# Patient Record
Sex: Female | Born: 1935 | Race: White | Hispanic: No | Marital: Single | Smoking: Never smoker
Health system: Southern US, Community
[De-identification: ages and names within clinical notes are randomized; demographics above are authoritative.]

## PROBLEM LIST (undated history)

## (undated) DIAGNOSIS — Z66 Do not resuscitate: Secondary | ICD-10-CM

## (undated) DIAGNOSIS — G43909 Migraine, unspecified, not intractable, without status migrainosus: Secondary | ICD-10-CM

## (undated) DIAGNOSIS — E538 Deficiency of other specified B group vitamins: Secondary | ICD-10-CM

## (undated) DIAGNOSIS — R413 Other amnesia: Secondary | ICD-10-CM

## (undated) DIAGNOSIS — E079 Disorder of thyroid, unspecified: Secondary | ICD-10-CM

## (undated) DIAGNOSIS — M810 Age-related osteoporosis without current pathological fracture: Secondary | ICD-10-CM

## (undated) DIAGNOSIS — C719 Malignant neoplasm of brain, unspecified: Secondary | ICD-10-CM

## (undated) DIAGNOSIS — I10 Essential (primary) hypertension: Secondary | ICD-10-CM

## (undated) HISTORY — PX: BRAIN SURGERY: SHX531

## (undated) HISTORY — PX: APPENDECTOMY: SHX54

---

## 2011-09-17 ENCOUNTER — Ambulatory Visit: Payer: Self-pay | Admitting: Physical Therapy

## 2012-04-12 ENCOUNTER — Encounter (HOSPITAL_COMMUNITY): Payer: Self-pay

## 2012-04-12 ENCOUNTER — Emergency Department (HOSPITAL_COMMUNITY): Payer: Medicare Other

## 2012-04-12 ENCOUNTER — Emergency Department (HOSPITAL_COMMUNITY)
Admission: EM | Admit: 2012-04-12 | Discharge: 2012-04-13 | Disposition: A | Discharge status: Skilled Nursing Facility | Payer: Medicare Other | Attending: Emergency Medicine | Admitting: Emergency Medicine

## 2012-04-12 DIAGNOSIS — S99921A Unspecified injury of right foot, initial encounter: Secondary | ICD-10-CM

## 2012-04-12 DIAGNOSIS — M79609 Pain in unspecified limb: Secondary | ICD-10-CM | POA: Insufficient documentation

## 2012-04-12 DIAGNOSIS — M810 Age-related osteoporosis without current pathological fracture: Secondary | ICD-10-CM | POA: Insufficient documentation

## 2012-04-12 DIAGNOSIS — R1013 Epigastric pain: Secondary | ICD-10-CM

## 2012-04-12 DIAGNOSIS — Y93E2 Activity, laundry: Secondary | ICD-10-CM | POA: Insufficient documentation

## 2012-04-12 DIAGNOSIS — S8990XA Unspecified injury of unspecified lower leg, initial encounter: Secondary | ICD-10-CM | POA: Insufficient documentation

## 2012-04-12 DIAGNOSIS — Z66 Do not resuscitate: Secondary | ICD-10-CM | POA: Insufficient documentation

## 2012-04-12 DIAGNOSIS — R296 Repeated falls: Secondary | ICD-10-CM | POA: Insufficient documentation

## 2012-04-12 DIAGNOSIS — I10 Essential (primary) hypertension: Secondary | ICD-10-CM | POA: Insufficient documentation

## 2012-04-12 HISTORY — DX: Age-related osteoporosis without current pathological fracture: M81.0

## 2012-04-12 HISTORY — DX: Disorder of thyroid, unspecified: E07.9

## 2012-04-12 HISTORY — DX: Migraine, unspecified, not intractable, without status migrainosus: G43.909

## 2012-04-12 HISTORY — DX: Malignant neoplasm of brain, unspecified: C71.9

## 2012-04-12 HISTORY — DX: Essential (primary) hypertension: I10

## 2012-04-12 HISTORY — DX: Other amnesia: R41.3

## 2012-04-12 HISTORY — DX: Do not resuscitate: Z66

## 2012-04-12 HISTORY — DX: Deficiency of other specified B group vitamins: E53.8

## 2012-04-12 LAB — BASIC METABOLIC PANEL
CO2: 26 mEq/L (ref 19–32)
Calcium: 9.9 mg/dL (ref 8.4–10.5)
Glucose, Bld: 89 mg/dL (ref 70–99)
Sodium: 141 mEq/L (ref 135–145)

## 2012-04-12 LAB — CBC
HCT: 37.1 % (ref 36.0–46.0)
Hemoglobin: 12.4 g/dL (ref 12.0–15.0)
MCH: 30 pg (ref 26.0–34.0)
RBC: 4.13 MIL/uL (ref 3.87–5.11)

## 2012-04-12 MED ORDER — ALUM & MAG HYDROXIDE-SIMETH 200-200-20 MG/5ML PO SUSP
15.0000 mL | Freq: Once | ORAL | Status: AC
  Administered 2012-04-12: 15 mL via ORAL
  Filled 2012-04-12: qty 30

## 2012-04-12 MED ORDER — ASPIRIN 81 MG PO CHEW
324.0000 mg | CHEWABLE_TABLET | Freq: Once | ORAL | Status: AC
  Administered 2012-04-12: 324 mg via ORAL
  Filled 2012-04-12: qty 4

## 2012-04-12 NOTE — ED Notes (Signed)
Per [facility]- Pt presents in no acute distress- Pt resides at [facility] DNR yellow cop present-  Pt informed EMS- Pt was hanging clothes in closet and tripped and fell. Only complaint rt foot pain- no deformity present.  Pt found by EMS- sitting in rec room sitting with family- alert and active with care.  Only pain is with movement to rt foot

## 2012-04-13 LAB — POCT I-STAT TROPONIN I: Troponin i, poc: 0 ng/mL (ref 0.00–0.08)

## 2012-04-13 MED ORDER — ACETAMINOPHEN 500 MG PO TABS: 500.0000 mg | ORAL_TABLET | Freq: Four times a day (QID) | ORAL | Status: AC | PRN

## 2012-04-13 MED ORDER — ALUM & MAG HYDROXIDE-SIMETH 200-200-20 MG/5ML PO SUSP: 15.0000 mL | ORAL | Status: AC | PRN

## 2012-04-13 NOTE — ED Provider Notes (Signed)
History     CSN: [id number]  Arrival date & time 04/12/12  1825   First MD Initiated Contact with Patient 04/12/12 1945      Chief Complaint  Patient presents with  . Fall  . Foot Pain    (Consider location/radiation/quality/duration/timing/severity/associated sxs/prior treatment) The history is provided by the patient and a relative.   patient is an 78 year old female who presents the emergency department with a chief complaint of a fall that occurred around lunchtime today. She was pain closed in the closet in the door began to close, making her lose her balance and fall to ground. Although she did not initially have any pain, she began to develop pain to the dorsum of the right forefoot and midfoot. She denies any wounds or skin color changes. Denies any new numbness or weakness. Denies any increase in swelling from her baseline; she has chronic lymphedema to bilateral lower extremities. Her pain is worse with attempted ambulation and movement of the foot. No alleviating factors. No prior treatment attempted. In addition, the patient and her daughter note that for the last 2 weeks she has had epigastric  Burning with radiation to the substernal region that occurs after every meal. There is no associated shortness of breath, nausea, diaphoresis. She has had no fever or upper respiratory illness recently. She has attempted no prior treatment. She has no known history of coronary artery disease or hyperlipidemia; does note a history of hypertension. Has had negative cardiac testing in the past though it has been several years. Recently moved to the area.  Past Medical History  Diagnosis Date  . Hypertension   . Migraine, unspecified, without mention of intractable migraine without mention of status migrainosus   . Memory loss   . Osteoporosis   . Unspecified disorder of thyroid   . Malignant neoplasm of brain, unspecified site   . Other B-complex deficiencies   . DNR (do not resuscitate)      No past surgical history on file.  No family history on file.  History  Substance Use Topics  . Smoking status: No  . Smokeless tobacco: Not on file  . Alcohol Use: No     Review of Systems 10 systems reviewed and are negative for acute change except as noted in the HPI.  Allergies  Banana; Demerol; and Penicillins  Home Medications   Current Outpatient Rx  Name Route Sig Dispense Refill  . BISOPROLOL FUMARATE 10 MG PO TABS Oral Take 10 mg by mouth daily.    Marland Kitchen VITAMIN B-12 100 MCG PO TABS Oral Take 50 mcg by mouth daily.      BP 163/87  Pulse 63  Temp 97.8 F (36.6 C) (Oral)  Resp 18  Wt 135 lb (61.236 kg)  SpO2 100%  Physical Exam  Nursing note and vitals reviewed. Constitutional: She is oriented to person, place, and time. She appears well-developed and well-nourished. No distress.  HENT:  Head: Normocephalic and atraumatic.  Right Ear: External ear normal.  Left Ear: External ear normal.  Mouth/Throat: Oropharynx is clear and moist.  Eyes: Conjunctivae are normal.  Neck: Neck supple.  Cardiovascular: Normal rate, regular rhythm and normal heart sounds.   No murmur heard.      Bilateral radial and DP pulses are 2+  Pulmonary/Chest: Effort normal and breath sounds normal. No respiratory distress. She has no wheezes. She has no rales. She exhibits no tenderness.  Abdominal: Soft. Bowel sounds are normal. She exhibits no distension. There is no  tenderness. There is no guarding.  Musculoskeletal:       Right foot: She exhibits tenderness. She exhibits normal range of motion, no bony tenderness and normal capillary refill.       2+ edema noted to bilateral lower legs and feet.  Neurological: She is alert and oriented to person, place, and time.       Strength to bilateral plantar and dorsi flexion is 4/5 and symmetric. Sensation is intact to light touch in bilateral lower extremities.  Skin: Skin is warm and dry. No rash noted. No erythema.    ED Course    Procedures (including critical care time)  Labs Reviewed  BASIC METABOLIC PANEL - Abnormal; Notable for the following:    GFR calc non Af Amer 85 (*)     All other components within normal limits  CBC  TROPONIN I  POCT I-STAT TROPONIN I  LAB REPORT - SCANNED   Dg Chest 2 View  04/12/2012  *RADIOLOGY REPORT*  Clinical Data: Epigastric pain  CHEST - 2 VIEW  Comparison: None.  Findings: Moderate cardiomegaly.  Clear lungs.  Osteopenia. Wedging of mid level thoracic vertebral bodies has a chronic appearance.  No definite acute fracture.  No pneumothorax.  No pleural effusion.  IMPRESSION: Cardiomegaly without edema.  Original Report Authenticated By: Donavan Burnet, M.D.   Dg Foot Complete Right  04/12/2012  *RADIOLOGY REPORT*  Clinical Data: Pain.  Fall.  RIGHT FOOT COMPLETE - 3+ VIEW  Comparison: None.  Findings: Osteopenia.  Mild hallux valgus.  No acute fracture and no dislocation.  Marked soft tissue swelling over the dorsum of the forefoot.  Minimal spurring at the posterior calcaneus.  Mild degenerative change in the midfoot.  IMPRESSION: No acute bony pathology.  Soft tissue swelling over the forefoot. Degenerative changes.  Original Report Authenticated By: Donavan Burnet, M.D.     No diagnosis found.    MDM  Patient is a DO NOT RESUSCITATE. Multiple complaints in elderly female. Regarding foot pain after fall, there is no deformity on exam and x-rays significant only for soft tissue swelling that may be chronic. No neurologic or motor deficit. Patient was able to ambulate in the emergency department with assistance of a walker, as per her norm. Regarding epigastric pain, suspect related to GI etiology. However, given her age, and EKG, chest x-ray, and cardiac labs are ordered for further evaluation. Maalox was given in emergency department with complete symptom relief. Troponin is negative x2. EKG with no acute findings, sinus with T wave inversion in aVR, leads V1 and V2. No priors  available for comparison. Doubt ACS. Patient is discussed with attending physician and it is felt that admission to the hospital for further evaluation is not warranted given her history, PE, and ED findings. Strict return precautions were discussed. She will follow-up with her primary doctor as soon as possible for recheck.         Shaaron Adler, [place] 04/13/12 2235

## 2012-04-14 ENCOUNTER — Other Ambulatory Visit: Payer: Self-pay | Admitting: Geriatric Medicine

## 2012-04-14 DIAGNOSIS — Z1231 Encounter for screening mammogram for malignant neoplasm of breast: Secondary | ICD-10-CM

## 2012-04-14 NOTE — ED Provider Notes (Signed)
Medical screening examination/treatment/procedure(s) were conducted as a shared visit with non-physician practitioner(s) and myself.  I personally evaluated the patient during the encounter  Allison Kastens, MD 04/14/12 0212 

## 2012-04-25 ENCOUNTER — Ambulatory Visit: Payer: Medicare Other

## 2012-07-01 ENCOUNTER — Emergency Department (HOSPITAL_COMMUNITY): Payer: Medicare Other

## 2012-07-01 ENCOUNTER — Encounter (HOSPITAL_COMMUNITY): Payer: Self-pay | Admitting: *Deleted

## 2012-07-01 ENCOUNTER — Emergency Department (HOSPITAL_COMMUNITY)
Admission: EM | Admit: 2012-07-01 | Discharge: 2012-07-01 | Disposition: A | Discharge status: Home / Self Care | Payer: Medicare Other | Attending: Emergency Medicine | Admitting: Emergency Medicine

## 2012-07-01 DIAGNOSIS — Y998 Other external cause status: Secondary | ICD-10-CM | POA: Insufficient documentation

## 2012-07-01 DIAGNOSIS — Z66 Do not resuscitate: Secondary | ICD-10-CM | POA: Insufficient documentation

## 2012-07-01 DIAGNOSIS — E079 Disorder of thyroid, unspecified: Secondary | ICD-10-CM | POA: Insufficient documentation

## 2012-07-01 DIAGNOSIS — S8000XA Contusion of unspecified knee, initial encounter: Secondary | ICD-10-CM | POA: Insufficient documentation

## 2012-07-01 DIAGNOSIS — I10 Essential (primary) hypertension: Secondary | ICD-10-CM | POA: Insufficient documentation

## 2012-07-01 DIAGNOSIS — Z85841 Personal history of malignant neoplasm of brain: Secondary | ICD-10-CM | POA: Insufficient documentation

## 2012-07-01 DIAGNOSIS — Z79899 Other long term (current) drug therapy: Secondary | ICD-10-CM | POA: Insufficient documentation

## 2012-07-01 DIAGNOSIS — S42293A Other displaced fracture of upper end of unspecified humerus, initial encounter for closed fracture: Secondary | ICD-10-CM | POA: Insufficient documentation

## 2012-07-01 DIAGNOSIS — M810 Age-related osteoporosis without current pathological fracture: Secondary | ICD-10-CM | POA: Insufficient documentation

## 2012-07-01 DIAGNOSIS — S42202A Unspecified fracture of upper end of left humerus, initial encounter for closed fracture: Secondary | ICD-10-CM

## 2012-07-01 DIAGNOSIS — Y9210 Unspecified residential institution as the place of occurrence of the external cause: Secondary | ICD-10-CM | POA: Insufficient documentation

## 2012-07-01 DIAGNOSIS — W010XXA Fall on same level from slipping, tripping and stumbling without subsequent striking against object, initial encounter: Secondary | ICD-10-CM | POA: Insufficient documentation

## 2012-07-01 DIAGNOSIS — G43909 Migraine, unspecified, not intractable, without status migrainosus: Secondary | ICD-10-CM | POA: Insufficient documentation

## 2012-07-01 DIAGNOSIS — F0390 Unspecified dementia without behavioral disturbance: Secondary | ICD-10-CM | POA: Insufficient documentation

## 2012-07-01 DIAGNOSIS — R413 Other amnesia: Secondary | ICD-10-CM | POA: Insufficient documentation

## 2012-07-01 MED ORDER — ACETAMINOPHEN 325 MG PO TABS
650.0000 mg | ORAL_TABLET | Freq: Once | ORAL | Status: AC
  Administered 2012-07-01: 650 mg via ORAL

## 2012-07-01 MED ORDER — HYDROCODONE-ACETAMINOPHEN 5-325 MG PO TABS: 1.0000 | ORAL_TABLET | Freq: Four times a day (QID) | ORAL | Status: DC | PRN

## 2012-07-01 MED ORDER — ACETAMINOPHEN 325 MG PO TABS
ORAL_TABLET | ORAL | Status: AC
  Administered 2012-07-01: 650 mg via ORAL
  Filled 2012-07-01: qty 2

## 2012-07-01 NOTE — ED Notes (Signed)
[facility] arrived to pick pt up, transported with chart, DNR form and personal belongings, condition stable at time of transfer.

## 2012-07-01 NOTE — ED Notes (Signed)
Per EMS, pt from [facility] after walker slipped on her way to dining room causing her to fall and when she felt the walker slipping she reached for the rail injuring left shoulder and fell on right knee. EMS reports deformity and swelling. EMS reports inserting 20G IV in left forearm with IV Fentanyl given en route.

## 2012-07-01 NOTE — ED Notes (Signed)
RUE:[id number]<JW> Expected date:<BR> Expected time:<BR> Means of arrival:<BR> Comments:<BR> Fall-shoulder pain

## 2012-07-01 NOTE — ED Notes (Signed)
MD at bedside. 

## 2012-07-01 NOTE — ED Provider Notes (Signed)
History     CSN: [id number]  Arrival date & time 07/01/12  1401   First MD Initiated Contact with Patient 07/01/12 1458     Level V caveat history history is obtained from patient and patient's family and from records accompanying patient Chief Complaint  Patient presents with  . Fall  . Shoulder Pain    left    (Consider location/radiation/quality/duration/timing/severity/associated sxs/prior treatment) Patient is a 78 y.o. female presenting with fall and shoulder pain.  Fall  Shoulder Pain   Patient tripped and fell while walking with her walker and assisted-living facility today approximately 12 noon injuring left shoulder and right knee she denies other injury denies other complaint denies loss of consciousness denies neck pain . Treated by EMS with IV fentanyl prior to coming here pain worse with palpation or movement Past Medical History  Diagnosis Date  . Hypertension   . Migraine, unspecified, without mention of intractable migraine without mention of status migrainosus   . Memory loss   . Osteoporosis   . Unspecified disorder of thyroid   . Malignant neoplasm of brain, unspecified site   . Other B-complex deficiencies   . DNR (do not resuscitate)     History reviewed. No pertinent past surgical history.  History reviewed. No pertinent family history.  History  Substance Use Topics  . Smoking status: Never Smoker   . Smokeless tobacco: Never Used  . Alcohol Use: No    OB History    Grav Para Term Preterm Abortions TAB SAB Ect Mult Living                  Review of Systems  Unable to perform ROS: Dementia  Musculoskeletal: Positive for arthralgias.    Allergies  Banana; Demerol; and Penicillins  Home Medications   Current Outpatient Rx  Name Route Sig Dispense Refill  . ACETAMINOPHEN 500 MG PO TABS Oral Take 500 mg by mouth every 4 (four) hours as needed. pain    . BISOPROLOL FUMARATE 10 MG PO TABS Oral Take 10 mg by mouth daily.    Marland Kitchen VITAMIN  D 1000 UNITS PO TABS Oral Take 1,000 Units by mouth daily.    . DONEPEZIL HCL 10 MG PO TABS Oral Take 10 mg by mouth at bedtime.    . OMEPRAZOLE 20 MG PO CPDR Oral Take 20 mg by mouth daily.    Marland Kitchen POLYETHYLENE GLYCOL 3350 PO PACK Oral Take 17 g by mouth daily.    . SENNOSIDES 8.6 MG PO TABS Oral Take 1 tablet by mouth daily.    Marland Kitchen VITAMIN B-12 1000 MCG PO TABS Oral Take 2,000 mcg by mouth daily.      BP 173/78  Pulse 60  Temp 98.2 F (36.8 C) (Oral)  Resp 18  SpO2 97%  Physical Exam  Nursing note and vitals reviewed. Constitutional: She appears well-developed and well-nourished.  HENT:  Head: Normocephalic and atraumatic.  Eyes: Conjunctivae normal are normal. Pupils are equal, round, and reactive to light.  Neck: Neck supple. No tracheal deviation present. No thyromegaly present.  Cardiovascular: Normal rate and regular rhythm.   No murmur heard. Pulmonary/Chest: Effort normal and breath sounds normal.  Abdominal: Soft. Bowel sounds are normal. She exhibits no distension. There is no tenderness.  Musculoskeletal: Normal range of motion. She exhibits no edema and no tenderness.       Left upper extremity tender at shoulder no obvious deformity radial pulse 2+ pain with active range of motion. Right lower extremity  2 cm ecchymosis overlying anterior knee with course point tenderness no deformity. Bilateral lower extremities with 3+ edema DP pulses 2+ bilaterally. Entire spine nontender pelvis stable nontender  Neurological: She is alert. Coordination normal.       Follow simple commands moves all extremities  Skin: Skin is warm and dry. No rash noted.  Psychiatric: She has a normal mood and affect.    ED Course  Procedures (including critical care time)  Labs Reviewed - No data to display No results found. Results for orders placed during the hospital encounter of 04/12/12  CBC      Component Value Range   WBC 6.5  4.0 - 10.5 K/uL   RBC 4.13  3.87 - 5.11 MIL/uL   Hemoglobin  12.4  12.0 - 15.0 g/dL   HCT 16.1  09.6 - 04.5 %   MCV 89.8  78.0 - 100.0 fL   MCH 30.0  26.0 - 34.0 pg   MCHC 33.4  30.0 - 36.0 g/dL   RDW 40.9  81.1 - 91.4 %   Platelets 322  150 - 400 K/uL  BASIC METABOLIC PANEL      Component Value Range   Sodium 141  135 - 145 mEq/L   Potassium 3.7  3.5 - 5.1 mEq/L   Chloride 104  96 - 112 mEq/L   CO2 26  19 - 32 mEq/L   Glucose, Bld 89  70 - 99 mg/dL   BUN 21  6 - 23 mg/dL   Creatinine, Ser 7.82  0.50 - 1.10 mg/dL   Calcium 9.9  8.4 - 95.6 mg/dL   GFR calc non Af Amer 85 (*) >90 mL/min   GFR calc Af Amer >90  >90 mL/min  TROPONIN I      Component Value Range   Troponin I <0.30  <0.30 ng/mL  POCT I-STAT TROPONIN I      Component Value Range   Troponin i, poc 0.00  0.00 - 0.08 ng/mL   Comment 3            Dg Shoulder Left  07/01/2012  *RADIOLOGY REPORT*  Clinical Data: Fall.  Shoulder pain.  LEFT SHOULDER - 2+ VIEW  Comparison: None.  Findings: Comminuted fracture of the left humeral neck and trochanteric region.  The humeral head remains aligned with the glenoid.  Mildly tortuous aorta.  IMPRESSION: Comminuted fracture of the left humeral neck and trochanteric region.  The humeral head remains aligned with the glenoid.   Original Report Authenticated By: Fuller Canada, M.D.    Dg Knee Complete 4 Views Right  07/01/2012  *RADIOLOGY REPORT*  Clinical Data: Fall.  RIGHT KNEE - COMPLETE 4+ VIEW  Comparison: None.  Findings: No fracture or dislocation.  CPPD.  IMPRESSION: No fracture or dislocation.   Original Report Authenticated By: Fuller Canada, M.D.      No diagnosis found.  4:45 PM patient comfortable after treatment with Tylenol X-rays reviewed by me  MDM  Plan shoulder immobilizer Prescription Norco Followup Dr.Norris (patient and family request) Diagnosis #1 fall #2 fracture left proximal humerus #3 contusion to right knee        Doug Sou, MD 07/01/12 1658

## 2012-07-01 NOTE — ED Notes (Signed)
[facility] called and informed of pt's return, report given to [place], Charity fundraiser. [facility] also called for transport back, awaiting arrival.

## 2013-10-28 ENCOUNTER — Encounter (HOSPITAL_COMMUNITY): Payer: Self-pay | Admitting: Emergency Medicine

## 2013-10-28 ENCOUNTER — Emergency Department (HOSPITAL_COMMUNITY)
Admission: EM | Admit: 2013-10-28 | Discharge: 2013-10-28 | Disposition: A | Discharge status: Other Acute Inpatient Hospital | Payer: Medicare Other | Attending: Emergency Medicine | Admitting: Emergency Medicine

## 2013-10-28 DIAGNOSIS — Z79899 Other long term (current) drug therapy: Secondary | ICD-10-CM | POA: Insufficient documentation

## 2013-10-28 DIAGNOSIS — R112 Nausea with vomiting, unspecified: Secondary | ICD-10-CM

## 2013-10-28 DIAGNOSIS — Z880 Allergy status to penicillin: Secondary | ICD-10-CM | POA: Insufficient documentation

## 2013-10-28 DIAGNOSIS — R413 Other amnesia: Secondary | ICD-10-CM | POA: Insufficient documentation

## 2013-10-28 DIAGNOSIS — I10 Essential (primary) hypertension: Secondary | ICD-10-CM | POA: Insufficient documentation

## 2013-10-28 DIAGNOSIS — Z8639 Personal history of other endocrine, nutritional and metabolic disease: Secondary | ICD-10-CM | POA: Insufficient documentation

## 2013-10-28 DIAGNOSIS — Z862 Personal history of diseases of the blood and blood-forming organs and certain disorders involving the immune mechanism: Secondary | ICD-10-CM | POA: Insufficient documentation

## 2013-10-28 DIAGNOSIS — M810 Age-related osteoporosis without current pathological fracture: Secondary | ICD-10-CM | POA: Insufficient documentation

## 2013-10-28 LAB — COMPREHENSIVE METABOLIC PANEL
ALBUMIN: 3.5 g/dL (ref 3.5–5.2)
ALK PHOS: 97 U/L (ref 39–117)
ALT: 29 U/L (ref 0–35)
AST: 23 U/L (ref 0–37)
BILIRUBIN TOTAL: 0.3 mg/dL (ref 0.3–1.2)
BUN: 22 mg/dL (ref 6–23)
CHLORIDE: 103 meq/L (ref 96–112)
CO2: 26 mEq/L (ref 19–32)
Calcium: 9.5 mg/dL (ref 8.4–10.5)
Creatinine, Ser: 0.55 mg/dL (ref 0.50–1.10)
GFR calc Af Amer: >90 mL/min (ref >90)
GFR calc non Af Amer: 83 mL/min — ABNORMAL LOW (ref >90)
Glucose, Bld: 102 mg/dL — ABNORMAL HIGH (ref 70–99)
Potassium: 4.4 mEq/L (ref 3.7–5.3)
Sodium: 142 mEq/L (ref 137–147)
Total Protein: 6.8 g/dL (ref 6.0–8.3)

## 2013-10-28 LAB — URINALYSIS, ROUTINE W REFLEX MICROSCOPIC
Bilirubin Urine: NEGATIVE
Glucose, UA: NEGATIVE mg/dL
Hgb urine dipstick: NEGATIVE
Ketones, ur: NEGATIVE mg/dL
Nitrite: NEGATIVE
Protein, ur: NEGATIVE mg/dL
Specific Gravity, Urine: 1.007 (ref 1.005–1.030)
Urobilinogen, UA: 0.2 mg/dL (ref 0.0–1.0)
pH: 7 (ref 5.0–8.0)

## 2013-10-28 LAB — CBC
HEMATOCRIT: 38.3 % (ref 36.0–46.0)
Hemoglobin: 12.8 g/dL (ref 12.0–15.0)
MCH: 30.6 pg (ref 26.0–34.0)
MCHC: 33.4 g/dL (ref 30.0–36.0)
MCV: 91.6 fL (ref 78.0–100.0)
PLATELETS: 304 10*3/uL (ref 150–400)
RBC: 4.18 MIL/uL (ref 3.87–5.11)
RDW: 14.8 % (ref 11.5–15.5)
WBC: 8.8 10*3/uL (ref 4.0–10.5)

## 2013-10-28 LAB — URINE MICROSCOPIC-ADD ON

## 2013-10-28 LAB — LIPASE, BLOOD: Lipase: 52 U/L (ref 11–59)

## 2013-10-28 MED ORDER — SODIUM CHLORIDE 0.9 % IV BOLUS (SEPSIS)
1000.0000 mL | Freq: Once | INTRAVENOUS | Status: AC
  Administered 2013-10-28: 1000 mL via INTRAVENOUS

## 2013-10-28 MED ORDER — ONDANSETRON HCL 4 MG/2ML IJ SOLN
4.0000 mg | Freq: Once | INTRAMUSCULAR | Status: AC
  Administered 2013-10-28: 4 mg via INTRAVENOUS

## 2013-10-28 MED ORDER — SODIUM CHLORIDE 0.9 % IV SOLN
INTRAVENOUS | Status: DC
  Administered 2013-10-28: 09:00:00 via INTRAVENOUS

## 2013-10-28 MED ORDER — ONDANSETRON HCL 4 MG/2ML IJ SOLN
4.0000 mg | Freq: Once | INTRAMUSCULAR | Status: AC
  Filled 2013-10-28: qty 2

## 2013-10-28 MED ORDER — ONDANSETRON HCL 4 MG PO TABS: 4.0000 mg | ORAL_TABLET | Freq: Four times a day (QID) | ORAL | Status: AC

## 2013-10-28 NOTE — ED Notes (Signed)
Bed: WA24 Expected date:  Expected time:  Means of arrival:  Comments: EMS/80 yo from SNF-vomiting

## 2013-10-28 NOTE — Discharge Instructions (Signed)

## 2013-10-28 NOTE — ED Provider Notes (Signed)
CSN: [id number]     Arrival date & time 10/28/13  0555 History   First MD Initiated Contact with Patient 10/28/13 [phone]     Chief Complaint  Patient presents with  . Emesis     (Consider location/radiation/quality/duration/timing/severity/associated sxs/prior Treatment) HPI  80yF with n/v. Onset early this morning. Went to bed in unusual state of health. Multiple episodes of vomiting. Brown/gray in color. Nothing that appeared like blood. No fever. Denies pain anywhere. No past history of abdominal or pelvic surgery. No urinary complaints. No diarrhea. Lives in NH and reports multiple other people coughing, but nobody with vomiting that she is aware of.   Past Medical History  Diagnosis Date  . Hypertension   . Migraine, unspecified, without mention of intractable migraine without mention of status migrainosus   . Memory loss   . Osteoporosis   . Unspecified disorder of thyroid   . Malignant neoplasm of brain, unspecified site   . Other B-complex deficiencies   . DNR (do not resuscitate)    History reviewed. No pertinent past surgical history. No family history on file. History  Substance Use Topics  . Smoking status: Never Smoker   . Smokeless tobacco: Never Used  . Alcohol Use: No   OB History   Grav Para Term Preterm Abortions TAB SAB Ect Mult Living                 Review of Systems    Allergies  Banana; Demerol; and Penicillins  Home Medications   Current Outpatient Rx  Name  Route  Sig  Dispense  Refill  . acetaminophen (TYLENOL) 500 MG tablet   Oral   Take 1,000 mg by mouth 2 (two) times daily. pain         . alendronate (FOSAMAX) 70 MG tablet   Oral   Take 70 mg by mouth once a week. Take with a full glass of water on an empty stomach.         . cholecalciferol (VITAMIN D) 1000 UNITS tablet   Oral   Take 1,000 Units by mouth daily.         Marland Kitchen donepezil (ARICEPT) 10 MG tablet   Oral   Take 10 mg by mouth at bedtime.         Marland Kitchen  HYDROcodone-acetaminophen (NORCO/VICODIN) 5-325 MG per tablet   Oral   Take 1 tablet by mouth every 6 (six) hours as needed for moderate pain.         Marland Kitchen lisinopril (PRINIVIL,ZESTRIL) 10 MG tablet   Oral   Take 10 mg by mouth daily.         Marland Kitchen menthol-cetylpyridinium (CEPACOL) 3 MG lozenge   Oral   Take 1 lozenge by mouth daily as needed for sore throat.         . polyethylene glycol (MIRALAX / GLYCOLAX) packet   Oral   Take 17 g by mouth every other day.          . sennosides-docusate sodium (SENOKOT-S) 8.6-50 MG tablet   Oral   Take 1 tablet by mouth daily.         . vitamin B-12 (CYANOCOBALAMIN) 1000 MCG tablet   Oral   Take 2,000 mcg by mouth daily.          BP 158/70  Pulse 63  Temp(Src) 98.7 F (37.1 C) (Oral)  Resp 16  SpO2 99% Physical Exam  Nursing note and vitals reviewed. Constitutional: She appears well-developed and well-nourished. No distress.  HENT:  Head: Normocephalic and atraumatic.  Eyes: Conjunctivae are normal. Right eye exhibits no discharge. Left eye exhibits no discharge.  Neck: Neck supple.  Cardiovascular: Normal rate, regular rhythm and normal heart sounds.  Exam reveals no gallop and no friction rub.   No murmur heard. Pulmonary/Chest: Effort normal and breath sounds normal. No respiratory distress.  Abdominal: Soft. She exhibits no distension. There is no rebound and no guarding.  Perhaps some mild tenderness around umbilicus  Musculoskeletal: She exhibits no edema and no tenderness.  Neurological: She is alert.  Skin: Skin is warm and dry.  Psychiatric: She has a normal mood and affect. Her behavior is normal. Thought content normal.    ED Course  Procedures (including critical care time) Labs Review Labs Reviewed  COMPREHENSIVE METABOLIC PANEL - Abnormal; Notable for the following:    Glucose, Bld 102 (*)    GFR calc non Af Amer 83 (*)    All other components within normal limits  URINALYSIS, ROUTINE W REFLEX  MICROSCOPIC - Abnormal; Notable for the following:    Leukocytes, UA TRACE (*)    All other components within normal limits  URINE MICROSCOPIC-ADD ON - Abnormal; Notable for the following:    Bacteria, UA FEW (*)    All other components within normal limits  URINE CULTURE  CBC  LIPASE, BLOOD   Imaging Review No results found.  EKG Interpretation   None       MDM   Final diagnoses:  Nausea and vomiting    80 year old female with nausea and vomiting since earlier this morning. Benign abdominal exam. We'll check some basic blood work to treat symptoms. Despite age, I do not feel strongly that needs imaging. Exam fairly unremarkable. Denies pain. HD stable. Appears well. Will check basic labs, UA and tx symptoms. Doubt anginal equivalent. Will continue to monitor as reassess.  Workup has been pretty unremarkable. Abdominal exam remains benign. Symptoms improved. Very low suspicion for emergent pathology. Continue symptomatic treatment. Return precautions discussed. Outpatient followup.  Virgel Manifold, MD 10/30/13 [phone]

## 2013-10-28 NOTE — ED Notes (Signed)
She tells me she has vomited up a couple of times "and it was thick 'brown-black phlegm'".  She also c/o upper abd. Pain.  She is unable to tell me exactly when this started, but she thinks it was "real early this morning". She is in no distress, and her skin is normal, warm and dry.

## 2013-10-28 NOTE — ED Notes (Signed)
Pt arrives by EMS from the [facility] with complaints of vomiting x 2 in the last 6 hours. Pt continues to c/o nausea-has had no meds given by the facility. Denies any abdominal pain.

## 2013-10-29 LAB — URINE CULTURE: Colony Count: >=100000

## 2015-02-22 ENCOUNTER — Encounter (HOSPITAL_BASED_OUTPATIENT_CLINIC_OR_DEPARTMENT_OTHER): Payer: PRIVATE HEALTH INSURANCE | Attending: General Surgery

## 2015-10-19 ENCOUNTER — Ambulatory Visit (HOSPITAL_BASED_OUTPATIENT_CLINIC_OR_DEPARTMENT_OTHER): Payer: PRIVATE HEALTH INSURANCE

## 2018-11-17 ENCOUNTER — Encounter (HOSPITAL_COMMUNITY): Payer: Self-pay | Admitting: Emergency Medicine

## 2018-11-17 ENCOUNTER — Emergency Department (HOSPITAL_COMMUNITY): Payer: Medicare Other

## 2018-11-17 ENCOUNTER — Other Ambulatory Visit: Payer: Self-pay

## 2018-11-17 ENCOUNTER — Emergency Department (HOSPITAL_COMMUNITY)
Admission: EM | Admit: 2018-11-17 | Discharge: 2018-11-17 | Disposition: A | Discharge status: Skilled Nursing Facility | Payer: Medicare Other | Attending: Emergency Medicine | Admitting: Emergency Medicine

## 2018-11-17 DIAGNOSIS — F0390 Unspecified dementia without behavioral disturbance: Secondary | ICD-10-CM | POA: Insufficient documentation

## 2018-11-17 DIAGNOSIS — I10 Essential (primary) hypertension: Secondary | ICD-10-CM | POA: Diagnosis not present

## 2018-11-17 DIAGNOSIS — S42412A Displaced simple supracondylar fracture without intercondylar fracture of left humerus, initial encounter for closed fracture: Secondary | ICD-10-CM | POA: Diagnosis not present

## 2018-11-17 DIAGNOSIS — Z79899 Other long term (current) drug therapy: Secondary | ICD-10-CM | POA: Insufficient documentation

## 2018-11-17 DIAGNOSIS — W19XXXA Unspecified fall, initial encounter: Secondary | ICD-10-CM | POA: Insufficient documentation

## 2018-11-17 DIAGNOSIS — Y92129 Unspecified place in nursing home as the place of occurrence of the external cause: Secondary | ICD-10-CM | POA: Insufficient documentation

## 2018-11-17 DIAGNOSIS — N390 Urinary tract infection, site not specified: Secondary | ICD-10-CM | POA: Diagnosis not present

## 2018-11-17 DIAGNOSIS — Y939 Activity, unspecified: Secondary | ICD-10-CM | POA: Insufficient documentation

## 2018-11-17 DIAGNOSIS — S59902A Unspecified injury of left elbow, initial encounter: Secondary | ICD-10-CM | POA: Diagnosis present

## 2018-11-17 DIAGNOSIS — Y999 Unspecified external cause status: Secondary | ICD-10-CM | POA: Insufficient documentation

## 2018-11-17 LAB — CBC WITH DIFFERENTIAL/PLATELET
Abs Immature Granulocytes: 0.11 10*3/uL — ABNORMAL HIGH (ref 0.00–0.07)
Basophils Absolute: 0 10*3/uL (ref 0.0–0.1)
Basophils Relative: 1 %
EOS PCT: 1 %
Eosinophils Absolute: 0.1 10*3/uL (ref 0.0–0.5)
HCT: 32.2 % — ABNORMAL LOW (ref 36.0–46.0)
HEMOGLOBIN: 9.8 g/dL — AB (ref 12.0–15.0)
Immature Granulocytes: 1 %
LYMPHS PCT: 14 %
Lymphs Abs: 1.2 10*3/uL (ref 0.7–4.0)
MCH: 28.2 pg (ref 26.0–34.0)
MCHC: 30.4 g/dL (ref 30.0–36.0)
MCV: 92.8 fL (ref 80.0–100.0)
MONO ABS: 1.1 10*3/uL — AB (ref 0.1–1.0)
Monocytes Relative: 13 %
Neutro Abs: 6.2 10*3/uL (ref 1.7–7.7)
Neutrophils Relative %: 70 %
Platelets: 374 10*3/uL (ref 150–400)
RBC: 3.47 MIL/uL — ABNORMAL LOW (ref 3.87–5.11)
RDW: 16.7 % — AB (ref 11.5–15.5)
WBC: 8.7 10*3/uL (ref 4.0–10.5)
nRBC: 0 % (ref 0.0–0.2)

## 2018-11-17 LAB — URINALYSIS, ROUTINE W REFLEX MICROSCOPIC
Bilirubin Urine: NEGATIVE
Glucose, UA: NEGATIVE mg/dL
KETONES UR: NEGATIVE mg/dL
Nitrite: POSITIVE — AB
Protein, ur: 30 mg/dL — AB
Specific Gravity, Urine: 1.024 (ref 1.005–1.030)
WBC, UA: >50 WBC/hpf — ABNORMAL HIGH (ref 0–5)
pH: 7 (ref 5.0–8.0)

## 2018-11-17 LAB — COMPREHENSIVE METABOLIC PANEL
ALT: 20 U/L (ref 0–44)
ANION GAP: 8 (ref 5–15)
AST: 22 U/L (ref 15–41)
Albumin: 3.1 g/dL — ABNORMAL LOW (ref 3.5–5.0)
Alkaline Phosphatase: 91 U/L (ref 38–126)
BUN: 29 mg/dL — AB (ref 8–23)
CHLORIDE: 106 mmol/L (ref 98–111)
CO2: 27 mmol/L (ref 22–32)
CREATININE: 0.58 mg/dL (ref 0.44–1.00)
Calcium: 9.2 mg/dL (ref 8.9–10.3)
GFR calc non Af Amer: >60 mL/min (ref >60)
Glucose, Bld: 133 mg/dL — ABNORMAL HIGH (ref 70–99)
Potassium: 3.8 mmol/L (ref 3.5–5.1)
SODIUM: 141 mmol/L (ref 135–145)
Total Bilirubin: 0.3 mg/dL (ref 0.3–1.2)
Total Protein: 6.3 g/dL — ABNORMAL LOW (ref 6.5–8.1)

## 2018-11-17 LAB — CK: Total CK: 93 U/L (ref 38–234)

## 2018-11-17 LAB — I-STAT TROPONIN, ED: Troponin i, poc: 0.02 ng/mL (ref 0.00–0.08)

## 2018-11-17 MED ORDER — SODIUM CHLORIDE 0.9 % IV SOLN
1.0000 g | Freq: Once | INTRAVENOUS | Status: AC
  Administered 2018-11-17: 1 g via INTRAVENOUS
  Filled 2018-11-17: qty 10

## 2018-11-17 MED ORDER — CEPHALEXIN 500 MG PO CAPS: 500.0000 mg | ORAL_CAPSULE | Freq: Two times a day (BID) | ORAL | 0 refills | Status: AC

## 2018-11-17 NOTE — ED Triage Notes (Signed)
Patient BIB [facility] from [facility]. Pt had unwitnessed incident that resulted in left elbow fracture. Facility unsure if pt fell. Pt has dementia, responding to pain only at this time. Lymphedema, and bruising noted to left arm. Rt arm is contracted. Onsite x-ray confirmed fx of humeral neck.

## 2018-11-17 NOTE — ED Notes (Signed)
[facility] contacted for Transport.

## 2018-11-17 NOTE — ED Provider Notes (Addendum)
[facility] DEPT Provider Note   CSN: [id number] Arrival date & time: 11/17/18  1555    History   Chief Complaint Chief Complaint  Patient presents with  . Arm Injury    HPI Allison Young is a 85 y.o. female with a past medical history of hypertension, dementia who presents to ED with a chief complaint of left arm pain.  Majority of history is provided by her sister at bedside at patient is nonverbal at baseline.  Patient resides at the [facility].  Sister states that she visited patient yesterday and noted that she was rubbing her left shoulder.  X-rays were done of her left shoulder yesterday and were negative.  She has history of remote injury several years ago of the left humerus.  She was then called this morning once when staff stated that they noticed some edema, bruising of her left elbow.  There is no specific incident that they can recall that may have caused this.  Sister is concerned that something may have happened when she was being changed or turned.  She has also noticed a bruise on her left hip for the past week as well as a bruise on her right arm.  Patient bilateral upper extremities are usually contracted at baseline.  Sister states that her behavior and mental status is at her baseline.  She is concerned that she may have a UTI.     HPI  Past Medical History:  Diagnosis Date  . DNR (do not resuscitate)   . Hypertension   . Malignant neoplasm of brain, unspecified site   . Memory loss   . Migraine, unspecified, without mention of intractable migraine without mention of status migrainosus   . Osteoporosis   . Other B-complex deficiencies   . Unspecified disorder of thyroid     There are no active problems to display for this patient.   Past Surgical History:  Procedure Laterality Date  . APPENDECTOMY    . BRAIN SURGERY       OB History   No obstetric history on file.      Home Medications    Prior to Admission  medications   Medication Sig Start Date End Date Taking? Authorizing Provider  acetaminophen (TYLENOL) 500 MG tablet Take 1,000 mg by mouth 2 (two) times daily. pain   Yes [provider]  guaiFENesin (ROBITUSSIN) 100 MG/5ML liquid Take 100 mg by mouth every 8 (eight) hours as needed for cough or congestion.   Yes [provider]  lisinopril (PRINIVIL,ZESTRIL) 5 MG tablet Take 5 mg by mouth daily.   Yes [provider]  Nutritional Supplements (ENSURE NUTRITION SHAKE) LIQD Take 230 mLs by mouth 2 (two) times daily.   Yes [provider]  polyethylene glycol (MIRALAX / GLYCOLAX) packet Take 17 g by mouth every other day.    Yes [provider]  sennosides-docusate sodium (SENOKOT-S) 8.6-50 MG tablet Take 1 tablet by mouth daily.   Yes [provider]  cephALEXin (KEFLEX) 500 MG capsule Take 1 capsule (500 mg total) by mouth 2 (two) times daily for 7 days. 11/17/18 11/24/18  Ebenezer Mccaskey, PA-C  ondansetron (ZOFRAN) 4 MG tablet Take 1 tablet (4 mg total) by mouth every 6 (six) hours. Patient not taking: Reported on 11/17/2018 10/28/13   Virgel Manifold, MD    Family History History reviewed. No pertinent family history.  Social History Social History   Tobacco Use  . Smoking status: Never Smoker  .  Smokeless tobacco: Never Used  Substance Use Topics  . Alcohol use: No  . Drug use: No     Allergies   Banana; Demerol [meperidine]; and Penicillins   Review of Systems Review of Systems  Unable to perform ROS: Dementia     Physical Exam Updated Vital Signs BP (!) 181/107   Pulse 81   Temp 98 F (36.7 C) (Rectal)   Resp 20   Ht 5' (1.524 m)   Wt 72.6 kg   SpO2 95%   BMI 31.25 kg/m   Physical Exam Vitals signs and nursing note reviewed.  Constitutional:      General: She is not in acute distress.    Appearance: She is well-developed.     Comments: Appears in no acute distress.  Unwilling to follow commands.  Does smile at  this time of her sister's voice.  HENT:     Head: Normocephalic and atraumatic.     Nose: Nose normal.  Eyes:     General: No scleral icterus.       Right eye: No discharge.        Left eye: No discharge.     Conjunctiva/sclera: Conjunctivae normal.  Neck:     Musculoskeletal: Normal range of motion and neck supple.  Cardiovascular:     Rate and Rhythm: Normal rate and regular rhythm.     Heart sounds: Normal heart sounds. No murmur. No friction rub. No gallop.   Pulmonary:     Effort: Pulmonary effort is normal. No respiratory distress.     Breath sounds: Normal breath sounds.  Abdominal:     General: Bowel sounds are normal. There is no distension.     Palpations: Abdomen is soft.     Tenderness: There is no abdominal tenderness. There is no guarding.  Musculoskeletal: Normal range of motion.     Right lower leg: Edema present.     Left lower leg: Edema present.     Comments: Right upper extremity contracted.  Edema, ecchymosis noted of the left elbow.  2+ radial pulses palpated bilaterally.  Bruising noted around left hip.  Bilateral lower extremity edema.  Skin:    General: Skin is warm and dry.     Findings: Bruising present. No rash.  Neurological:     Mental Status: She is alert.     Motor: No abnormal muscle tone.     Coordination: Coordination normal.      ED Treatments / Results  Labs (all labs ordered are listed, but only abnormal results are displayed) Labs Reviewed  COMPREHENSIVE METABOLIC PANEL - Abnormal; Notable for the following components:      Result Value   Glucose, Bld 133 (*)    BUN 29 (*)    Total Protein 6.3 (*)    Albumin 3.1 (*)    All other components within normal limits  CBC WITH DIFFERENTIAL/PLATELET - Abnormal; Notable for the following components:   RBC 3.47 (*)    Hemoglobin 9.8 (*)    HCT 32.2 (*)    RDW 16.7 (*)    Monocytes Absolute 1.1 (*)    Abs Immature Granulocytes 0.11 (*)    All other components within normal limits    URINALYSIS, ROUTINE W REFLEX MICROSCOPIC - Abnormal; Notable for the following components:   APPearance CLOUDY (*)    Hgb urine dipstick LARGE (*)    Protein, ur 30 (*)    Nitrite POSITIVE (*)    Leukocytes,Ua LARGE (*)    RBC /  HPF >50 (*)    WBC, UA >50 (*)    Bacteria, UA FEW (*)    Non Squamous Epithelial 0-5 (*)    All other components within normal limits  URINE CULTURE  CK  I-STAT TROPONIN, ED    EKG EKG Interpretation  Date/Time:  Monday November 17 2018 17:47:36 EST Ventricular Rate:  85 PR Interval:    QRS Duration: 126 QT Interval:  402 QTC Calculation: 478 R Axis:   1 Text Interpretation:  Sinus rhythm Atrial premature complexes Short PR interval Probable left atrial enlargement Left bundle branch block No significant change since last tracing Confirmed by Wandra Arthurs [phone]) on 11/17/2018 6:01:23 PM   Radiology Dg Elbow 2 Views Left  Result Date: 11/17/2018 CLINICAL DATA:  [facility]. EXAM: LEFT ELBOW - 2 VIEW COMPARISON:  None. FINDINGS: Displaced supracondylar fracture of the distal humerus. No other obvious fractures but these are 2 limited views. Associated moderate surrounding soft tissue swelling. IMPRESSION: Limited examination but there is a displaced supracondylar fracture of the distal humerus. Electronically Signed   By: Marijo Sanes M.D.   On: 11/17/2018 17:30   Ct Head Wo Contrast  Result Date: 11/17/2018 CLINICAL DATA:  [facility]. Dementia. Left central forehead scalp hematoma. EXAM: CT HEAD WITHOUT CONTRAST TECHNIQUE: Contiguous axial images were obtained from the base of the skull through the vertex without intravenous contrast. COMPARISON:  None. FINDINGS: Brain: There is an oval, elongated, calcified extra-axial mass in left frontal parafalcine region anteriorly. This measures 3.8 cm in length on sagittal image number 26 series 11 and 1.7 x 1.3 cm on axial image number 15 series 8. Moderately enlarged ventricles and subarachnoid spaces. Mild to moderate patchy  white matter low density in both cerebral hemispheres. No intracranial hemorrhage or CT evidence of acute infarction. Vascular: No hyperdense vessel or unexpected calcification. Skull: Normal. Negative for fracture or focal lesion. Sinuses/Orbits: Unremarkable. Other: Left concha bullosa with mild deviation of the adjacent portion of the nasal septum to the right. IMPRESSION: 1. No acute abnormality. 2. 3.8 x 1.7 x 1.3 cm left frontal parafalcine meningioma. 3. Moderate diffuse cerebral and cerebellar atrophy. 4. Mild to moderate chronic small vessel white matter ischemic changes in both cerebral hemispheres. Electronically Signed   By: Claudie Revering M.D.   On: 11/17/2018 19:52   Dg Pelvis Portable  Result Date: 11/17/2018 CLINICAL DATA:  Fall with left hip pain EXAM: PORTABLE PELVIS 1-2 VIEWS COMPARISON:  None. FINDINGS: There is no evidence of pelvic fracture or diastasis. Mild bilateral hip osteoarthrosis. No pelvic bone lesions are seen. Large stool ball in the rectum. IMPRESSION: 1. No pelvic fracture or diastasis. 2. Large rectal stool ball. Electronically Signed   By: Ulyses Jarred M.D.   On: 11/17/2018 18:16   Dg Chest Portable 1 View  Result Date: 11/17/2018 CLINICAL DATA:  Possible fall.  Left arm pain. EXAM: PORTABLE CHEST 1 VIEW COMPARISON:  April 12, 2012 FINDINGS: There is a fracture in the proximal left humerus, incompletely evaluated on this film. The patient's hand overlies lower right chest limit evaluation. Evaluation of the chest is limited due to positioning. Persistent cardiomegaly. The hila and mediastinum are unremarkable within visualize limits. No pneumothorax. No other acute abnormalities. IMPRESSION: 1. The limited evaluation of the proximal left humerus appears to demonstrate a fracture. Recommend correlation with the dedicated humerus films also obtained today. 2. No other acute abnormalities on today's limited study. Electronically Signed   By: Dorise Bullion III M.D   On:  11/17/2018 17:38   Dg Humerus Left  Result Date: 11/17/2018 CLINICAL DATA:  [facility] nursing home. EXAM: LEFT HUMERUS - 2+ VIEW COMPARISON:  None. FINDINGS: Humeral head and neck fractures with moderate impaction. These are probably remote. Dedicated shoulder films may be helpful. There is also a fracture at the left elbow. IMPRESSION: Humeral head and neck fractures with mild impaction, likely remote. Recommend dedicated shoulder films. Supracondylar distal humerus fracture.  Elbow films to follow. Electronically Signed   By: Marijo Sanes M.D.   On: 11/17/2018 17:27   Dg Femur 1 View Left  Result Date: 11/17/2018 CLINICAL DATA:  [place] EXAM: LEFT FEMUR 1 VIEW COMPARISON:  None. FINDINGS: There is no evidence of fracture or other focal bone lesions. Soft tissues are unremarkable. IMPRESSION: Negative. Electronically Signed   By: Ulyses Jarred M.D.   On: 11/17/2018 18:23    Procedures Procedures (including critical care time)  Medications Ordered in ED Medications  cefTRIAXone (ROCEPHIN) 1 g in sodium chloride 0.9 % 100 mL IVPB (0 g Intravenous Stopped 11/17/18 2146)     Initial Impression / Assessment and Plan / ED Course  I have reviewed the triage vital signs and the nursing notes.  Pertinent labs & imaging results that were available during my care of the patient were reviewed by me and considered in my medical decision making (see chart for details).        85 year old female presents to ED for left elbow pain.  She resides at a nursing facility.  Sister at bedside provides majority of history.  States that they called her today stating that patient had a fracture in her left elbow.  She is nonverbal at baseline.  Sister states that her mental status is at her baseline.  X-ray of the elbow show supracondylar fracture.  Humerus head and neck fracture.  Pelvis and femur are unremarkable.  CT of the head is negative for acute abnormality.  Chest x-ray is negative.  CBC, CMP, troponin  unremarkable.  CK is unremarkable.  EKG shows tracing similar to prior.  Urinalysis positive for leukocytes, pyuria, bacteria and sent for culture.  She was given IV Rocephin, placed in a long-arm splint with a sling.  Will be discharged home with Keflex advised her to follow-up with her PCP and orthopedist.  Patient is hemodynamically stable, in NAD, and able to ambulate in the ED. Evaluation does not show pathology that would require ongoing emergent intervention or inpatient treatment. I explained the diagnosis to the patient. Pain has been managed and has no complaints prior to discharge. Patient is comfortable with above plan and is stable for discharge at this time. All questions were answered prior to disposition. Strict return precautions for returning to the ED were discussed. Encouraged follow up with PCP.    Portions of this note were generated with [employer]. Dictation errors may occur despite best attempts at proofreading.   Final Clinical Impressions(s) / ED Diagnoses   Final diagnoses:  Lower urinary tract infectious disease  Closed supracondylar fracture of left humerus, initial encounter    ED Discharge Orders         Ordered    cephALEXin (KEFLEX) 500 MG capsule  2 times daily     11/17/18 2209               Delia Heady, PA-C 11/17/18 2305    Drenda Freeze, MD 11/18/18 [phone]

## 2018-11-17 NOTE — ED Notes (Signed)
Bed: WA25 Expected date:  Expected time:  Means of arrival:  Comments: EMS  

## 2018-11-17 NOTE — ED Notes (Signed)
Patient transported to CT 

## 2018-11-17 NOTE — Discharge Instructions (Signed)
Take antibiotics as directed. Return to the ED for worsening symptoms, changes to activity or appetite, fever, complaints of chest pain or abdominal pain.

## 2018-11-20 LAB — URINE CULTURE

## 2018-11-21 ENCOUNTER — Telehealth: Payer: Self-pay | Admitting: *Deleted

## 2018-11-21 NOTE — Telephone Encounter (Signed)
Post ED Visit - Positive Culture Follow-up  Culture report reviewed by antimicrobial stewardship pharmacist: [facility] Team []  Elenor Quinones, Pharm.D. []  Heide Guile, Pharm.D., BCPS AQ-ID []  Parks Neptune, Pharm.D., BCPS []  Alycia Rossetti, Pharm.D., BCPS []  [place], [place].D., BCPS, AAHIVP []  Legrand Como, Pharm.D., BCPS, AAHIVP []  Salome Arnt, PharmD, BCPS []  Johnnette Gourd, PharmD, BCPS []  Hughes Better, PharmD, BCPS []  Leeroy Cha, PharmD []  Laqueta Linden, PharmD, BCPS []  Albertina Parr, PharmD  [facility] Team []  Leodis Sias, PharmD []  Lindell Spar, PharmD []  Royetta Asal, PharmD []  Graylin Shiver, Rph []  Rema Fendt) Glennon Mac, PharmD []  Arlyn Dunning, PharmD [x]  Netta Cedars, PharmD []  Dia Sitter, PharmD []  Leone Haven, PharmD []  Gretta Arab, PharmD []  Theodis Shove, PharmD []  Peggyann Juba, PharmD []  Reuel Boom, PharmD   Positive urine culture Treated with  Cephalexin, organism sensitive to the same and no further patient follow-up is required at this time.  [facility] 11/21/2018, 3:38 PM

## 2019-04-07 ENCOUNTER — Non-Acute Institutional Stay: Payer: Medicare Other | Admitting: Internal Medicine

## 2019-04-07 DIAGNOSIS — Z515 Encounter for palliative care: Secondary | ICD-10-CM

## 2019-04-08 NOTE — Progress Notes (Signed)
    [facility] Consult Note Telephone: [phone]  Fax: [phone]  PATIENT NAME: Allison Young DOB: [birth year] MRN: [id number]  NOTE:  Phoned nursing supervisor Lattie Haw at [facility] to attempt a Palliative visit with patient.  She stated that patient status and labs had changed and she had been referred to [facility] today.  I thanked her for the information.   She will be discharged from palliative care census upon hospice admission.    Gonzella Lex, NP

## 2019-04-09 ENCOUNTER — Other Ambulatory Visit: Payer: Self-pay

## 2019-04-18 DEATH — deceased

## 2019-10-14 IMAGING — DX DG ELBOW 2V*L*
2 series · 2 of 2 positions shown · non-contrast
Comparison: None.

CLINICAL DATA: [place].

EXAM:
LEFT ELBOW - 2 VIEW

[elbow ap]
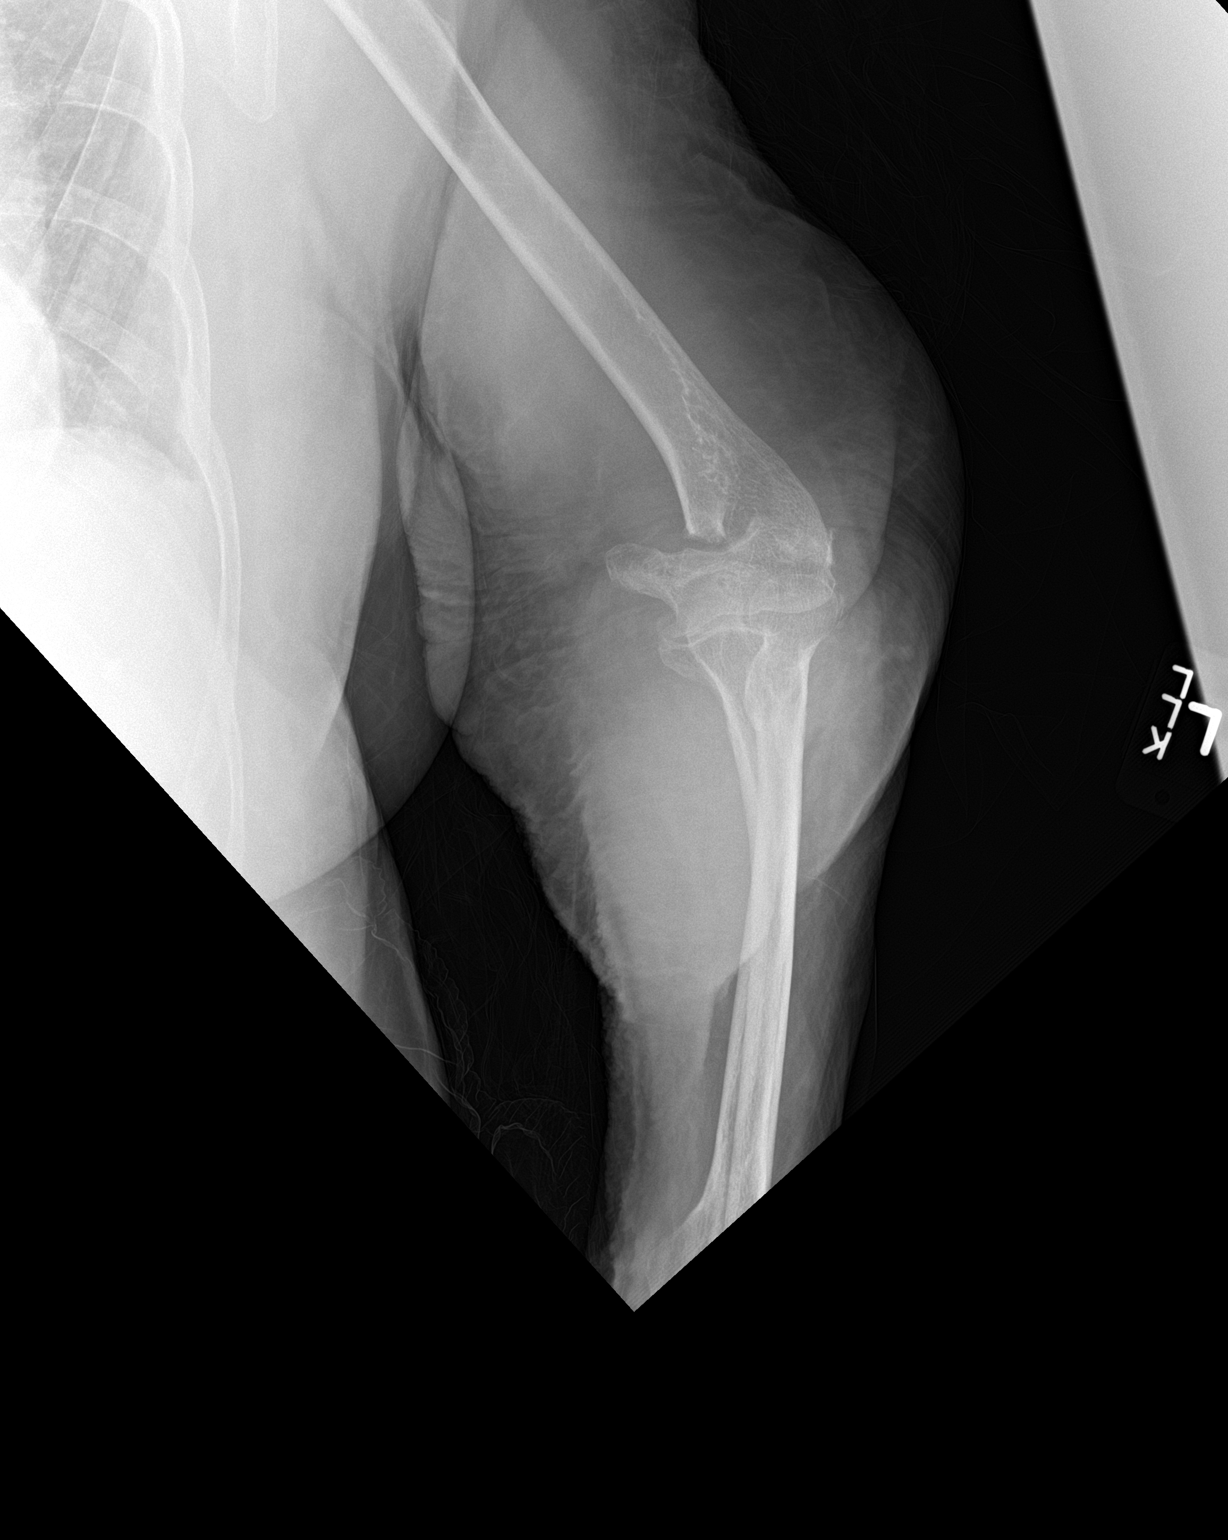

[elbow lat]
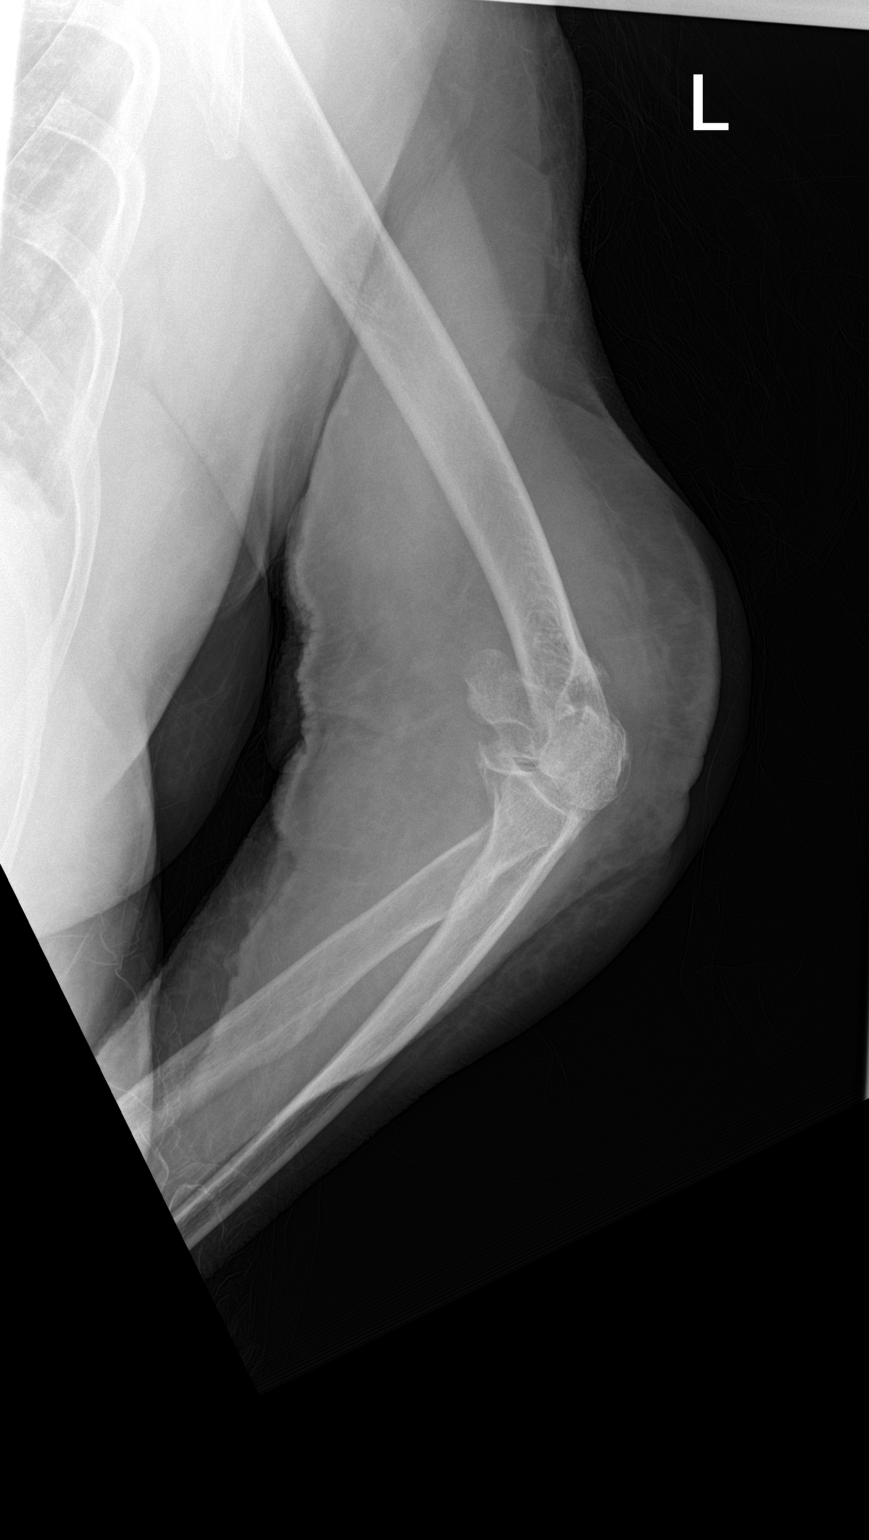

[2 of 2 positions shown; findings below may reference images not displayed]

FINDINGS: Displaced supracondylar fracture of the distal humerus. No other
obvious fractures but these are 2 limited views. Associated moderate
surrounding soft tissue swelling.
IMPRESSION: Limited examination but there is a displaced supracondylar fracture
of the distal humerus.
